# Patient Record
Sex: Female | Born: 1960 | Race: White | Hispanic: No | Marital: Married | Smoking: Never smoker
Health system: Southern US, Community
[De-identification: ages and names within clinical notes are randomized; demographics above are authoritative.]

## PROBLEM LIST (undated history)

## (undated) DIAGNOSIS — F419 Anxiety disorder, unspecified: Secondary | ICD-10-CM

## (undated) DIAGNOSIS — E039 Hypothyroidism, unspecified: Secondary | ICD-10-CM

## (undated) DIAGNOSIS — Z8249 Family history of ischemic heart disease and other diseases of the circulatory system: Secondary | ICD-10-CM

## (undated) DIAGNOSIS — B009 Herpesviral infection, unspecified: Secondary | ICD-10-CM

## (undated) DIAGNOSIS — G4700 Insomnia, unspecified: Secondary | ICD-10-CM

## (undated) DIAGNOSIS — J309 Allergic rhinitis, unspecified: Secondary | ICD-10-CM

## (undated) DIAGNOSIS — I341 Nonrheumatic mitral (valve) prolapse: Secondary | ICD-10-CM

## (undated) DIAGNOSIS — H409 Unspecified glaucoma: Secondary | ICD-10-CM

## (undated) DIAGNOSIS — M797 Fibromyalgia: Secondary | ICD-10-CM

## (undated) DIAGNOSIS — K760 Fatty (change of) liver, not elsewhere classified: Secondary | ICD-10-CM

## (undated) DIAGNOSIS — R079 Chest pain, unspecified: Secondary | ICD-10-CM

## (undated) DIAGNOSIS — K219 Gastro-esophageal reflux disease without esophagitis: Secondary | ICD-10-CM

## (undated) HISTORY — DX: Chest pain, unspecified: R07.9

## (undated) HISTORY — PX: WISDOM TOOTH EXTRACTION: SHX21

## (undated) HISTORY — PX: UTERINE FIBROID SURGERY: SHX826

## (undated) HISTORY — DX: Family history of ischemic heart disease and other diseases of the circulatory system: Z82.49

## (undated) HISTORY — PX: REPLACEMENT TOTAL KNEE: SUR1224

## (undated) HISTORY — DX: Allergic rhinitis, unspecified: J30.9

## (undated) HISTORY — DX: Fatty (change of) liver, not elsewhere classified: K76.0

## (undated) HISTORY — PX: CARDIAC CATHETERIZATION: SHX172

## (undated) HISTORY — DX: Hypothyroidism, unspecified: E03.9

## (undated) HISTORY — PX: BACK SURGERY: SHX140

## (undated) HISTORY — DX: Nonrheumatic mitral (valve) prolapse: I34.1

## (undated) HISTORY — DX: Herpesviral infection, unspecified: B00.9

## (undated) HISTORY — DX: Unspecified glaucoma: H40.9

## (undated) HISTORY — PX: TONSILLECTOMY: SUR1361

## (undated) HISTORY — PX: INTRAOCULAR LENS IMPLANT, SECONDARY: SHX1842

## (undated) HISTORY — DX: Fibromyalgia: M79.7

---

## 1999-01-11 ENCOUNTER — Ambulatory Visit (HOSPITAL_COMMUNITY): Admission: RE | Admit: 1999-01-11 | Discharge: 1999-01-11 | Payer: Self-pay | Admitting: Internal Medicine

## 1999-01-11 ENCOUNTER — Encounter: Payer: Self-pay | Admitting: Internal Medicine

## 1999-03-03 ENCOUNTER — Ambulatory Visit (HOSPITAL_COMMUNITY): Admission: RE | Admit: 1999-03-03 | Discharge: 1999-03-03 | Payer: Self-pay | Admitting: *Deleted

## 1999-11-26 ENCOUNTER — Other Ambulatory Visit: Admission: RE | Admit: 1999-11-26 | Discharge: 1999-11-26 | Payer: Self-pay | Admitting: *Deleted

## 2002-03-05 ENCOUNTER — Other Ambulatory Visit: Admission: RE | Admit: 2002-03-05 | Discharge: 2002-03-05 | Payer: Self-pay | Admitting: Obstetrics and Gynecology

## 2003-06-19 ENCOUNTER — Encounter: Admission: RE | Admit: 2003-06-19 | Discharge: 2003-06-19 | Payer: Self-pay | Admitting: Family Medicine

## 2003-06-19 ENCOUNTER — Encounter: Payer: Self-pay | Admitting: Family Medicine

## 2003-08-26 ENCOUNTER — Encounter: Admission: RE | Admit: 2003-08-26 | Discharge: 2003-08-26 | Payer: Self-pay | Admitting: Family Medicine

## 2004-12-08 ENCOUNTER — Other Ambulatory Visit: Admission: RE | Admit: 2004-12-08 | Discharge: 2004-12-08 | Payer: Self-pay | Admitting: Family Medicine

## 2005-01-04 ENCOUNTER — Emergency Department (HOSPITAL_COMMUNITY): Admission: EM | Admit: 2005-01-04 | Discharge: 2005-01-04 | Payer: Self-pay | Admitting: Emergency Medicine

## 2005-06-27 ENCOUNTER — Encounter: Admission: RE | Admit: 2005-06-27 | Discharge: 2005-06-27 | Payer: Self-pay | Admitting: Family Medicine

## 2005-08-17 ENCOUNTER — Ambulatory Visit (HOSPITAL_COMMUNITY): Admission: RE | Admit: 2005-08-17 | Discharge: 2005-08-17 | Payer: Self-pay | Admitting: Obstetrics and Gynecology

## 2005-08-17 ENCOUNTER — Encounter (INDEPENDENT_AMBULATORY_CARE_PROVIDER_SITE_OTHER): Payer: Self-pay | Admitting: Specialist

## 2005-08-17 ENCOUNTER — Ambulatory Visit (HOSPITAL_BASED_OUTPATIENT_CLINIC_OR_DEPARTMENT_OTHER): Admission: RE | Admit: 2005-08-17 | Discharge: 2005-08-17 | Payer: Self-pay | Admitting: Obstetrics and Gynecology

## 2006-05-22 ENCOUNTER — Encounter: Admission: RE | Admit: 2006-05-22 | Discharge: 2006-05-22 | Payer: Self-pay | Admitting: Emergency Medicine

## 2006-05-29 ENCOUNTER — Encounter: Admission: RE | Admit: 2006-05-29 | Discharge: 2006-05-29 | Payer: Self-pay | Admitting: Emergency Medicine

## 2006-06-28 ENCOUNTER — Encounter: Admission: RE | Admit: 2006-06-28 | Discharge: 2006-06-28 | Payer: Self-pay | Admitting: Emergency Medicine

## 2007-10-27 IMAGING — CR DG CHEST 2V
2 series · 2 of 2 positions shown · non-contrast
Comparison: 06/27/05.

CLINICAL DATA: Cough and fever.  Question pneumonia.  
 TWO VIEW CHEST:

[w chest pa]
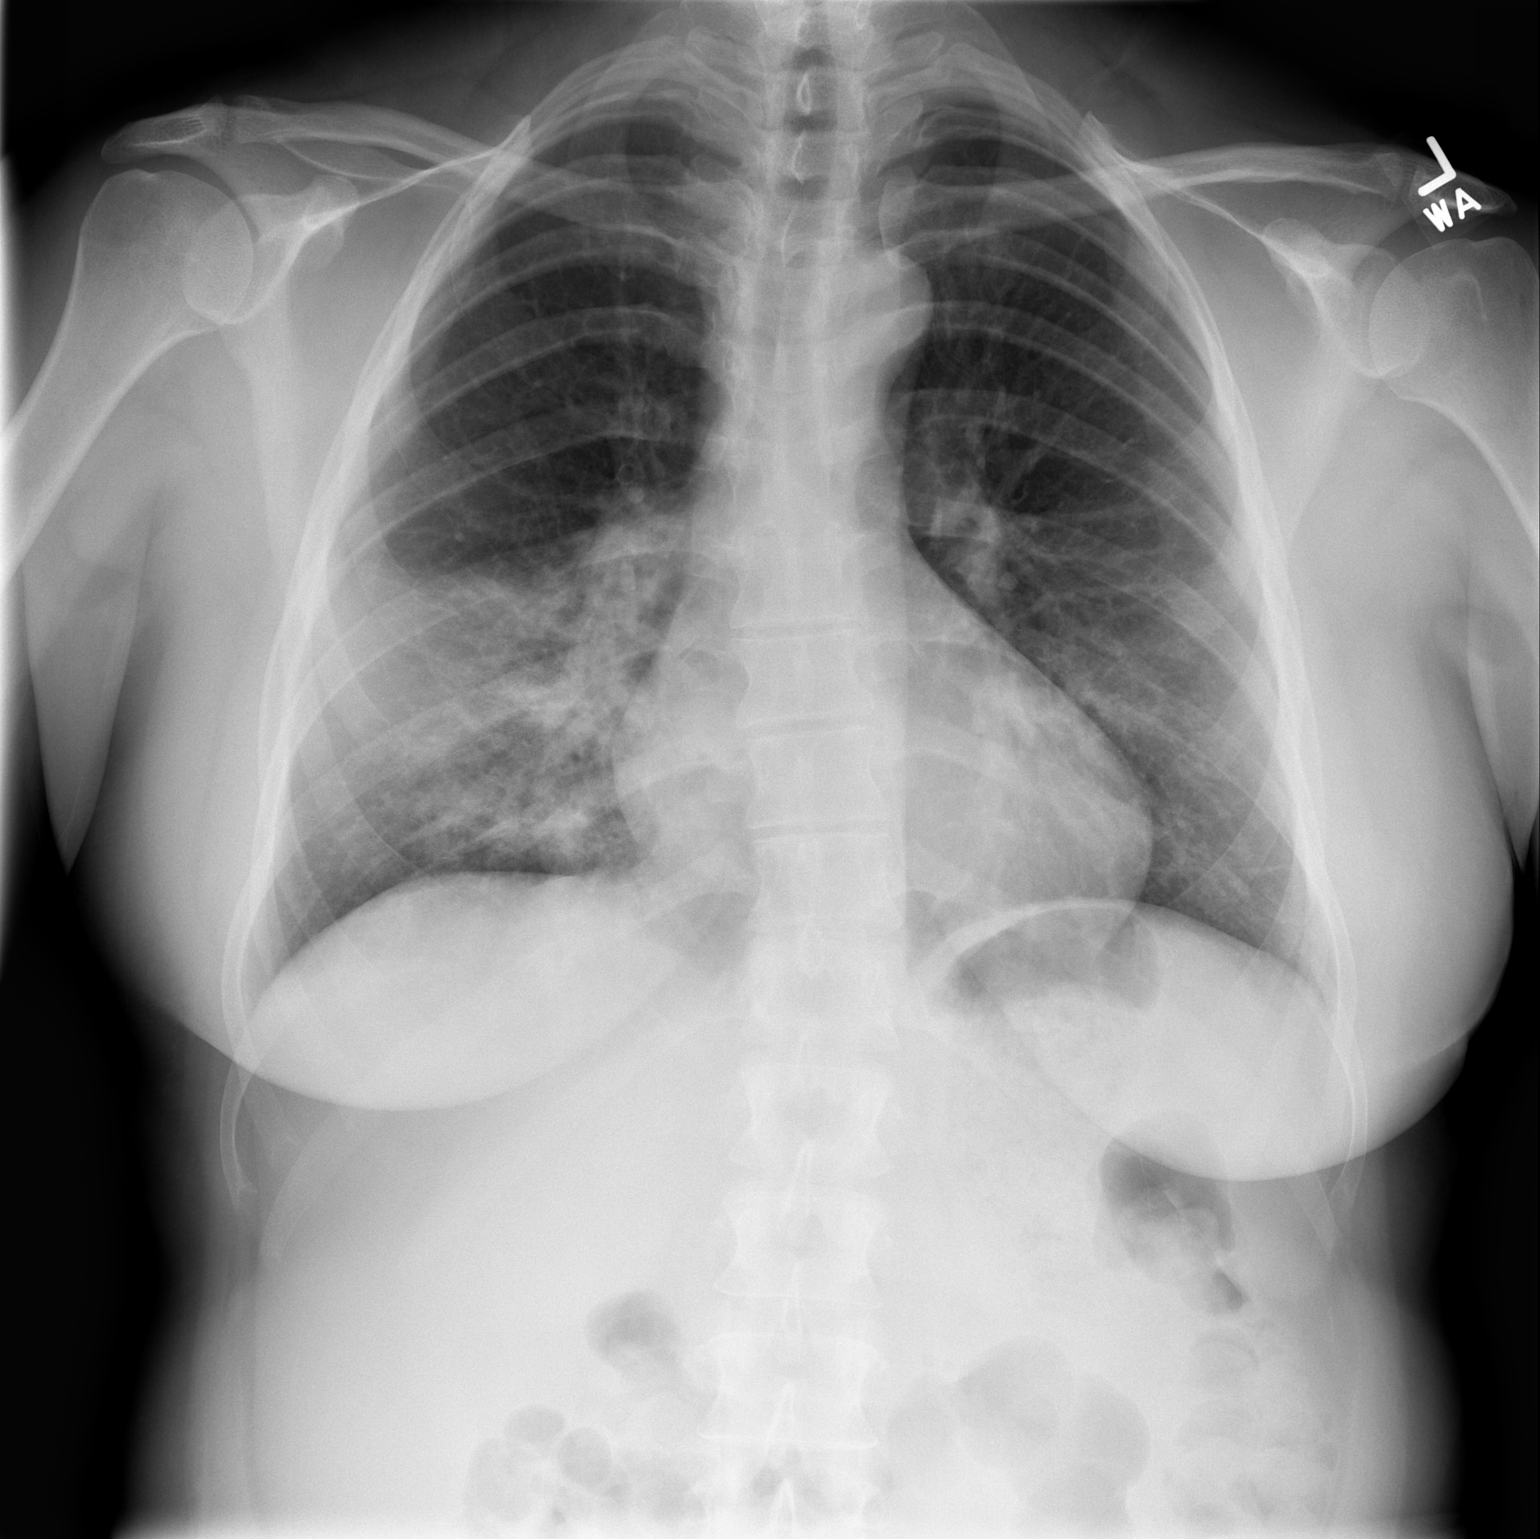

[w chest lat]
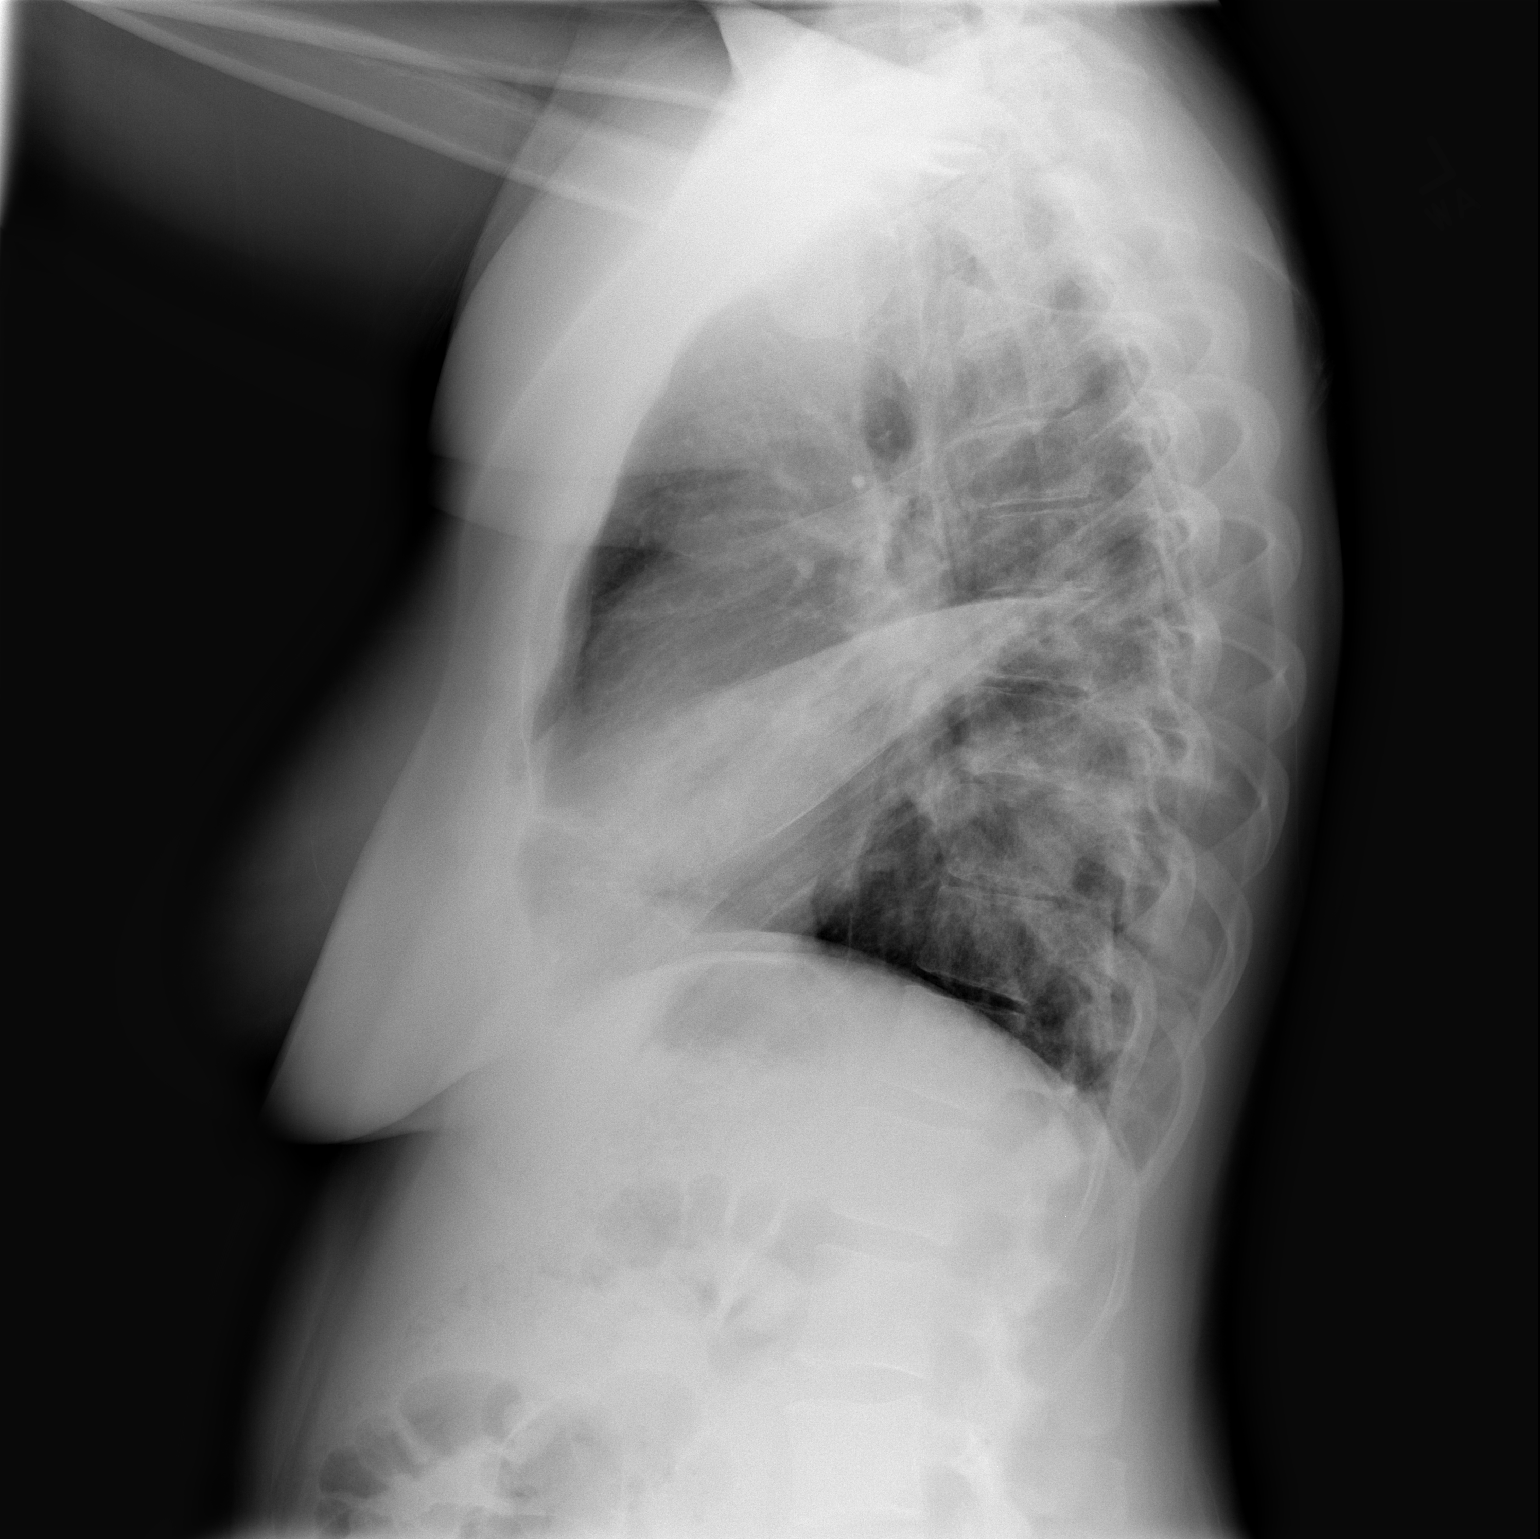

[2 of 2 positions shown; findings below may reference images not displayed]

FINDINGS: Midline trachea.  Heart size is at the upper limits of normal.  Mild prominence of the transverse aorta.  Costophrenic angles are sharp.  There is new dense air space disease in the right middle lobe.  There is more patchy air space disease in the superior segment right lower and likely superior segment left lower lobes.  No pneumothorax.  Mild S shaped spinal curvature.
IMPRESSION: Interval development of right greater than left patchy air space opacities most consistent with multifocal pneumonia.  Recommend radiographic follow up to confirm clearance.

## 2008-05-21 ENCOUNTER — Observation Stay (HOSPITAL_COMMUNITY): Admission: EM | Admit: 2008-05-21 | Discharge: 2008-05-21 | Payer: Self-pay | Admitting: Emergency Medicine

## 2008-06-04 ENCOUNTER — Inpatient Hospital Stay (HOSPITAL_BASED_OUTPATIENT_CLINIC_OR_DEPARTMENT_OTHER): Admission: RE | Admit: 2008-06-04 | Discharge: 2008-06-04 | Payer: Self-pay | Admitting: Interventional Cardiology

## 2008-06-13 ENCOUNTER — Encounter: Admission: RE | Admit: 2008-06-13 | Discharge: 2008-06-13 | Payer: Self-pay | Admitting: Family Medicine

## 2008-06-20 ENCOUNTER — Encounter: Admission: RE | Admit: 2008-06-20 | Discharge: 2008-06-20 | Payer: Self-pay | Admitting: Gastroenterology

## 2009-01-20 ENCOUNTER — Encounter: Admission: RE | Admit: 2009-01-20 | Discharge: 2009-01-20 | Payer: Self-pay | Admitting: Family Medicine

## 2009-08-14 ENCOUNTER — Encounter: Admission: RE | Admit: 2009-08-14 | Discharge: 2009-08-14 | Payer: Self-pay | Admitting: Family Medicine

## 2009-08-24 ENCOUNTER — Ambulatory Visit: Payer: Self-pay | Admitting: Thoracic Surgery

## 2009-09-07 ENCOUNTER — Ambulatory Visit (HOSPITAL_COMMUNITY): Admission: RE | Admit: 2009-09-07 | Discharge: 2009-09-07 | Payer: Self-pay | Admitting: Thoracic Surgery

## 2009-09-08 ENCOUNTER — Ambulatory Visit: Payer: Self-pay | Admitting: Thoracic Surgery

## 2010-02-11 ENCOUNTER — Encounter: Admission: RE | Admit: 2010-02-11 | Discharge: 2010-02-11 | Payer: Self-pay | Admitting: Family Medicine

## 2010-09-15 ENCOUNTER — Ambulatory Visit (HOSPITAL_COMMUNITY)
Admission: RE | Admit: 2010-09-15 | Discharge: 2010-09-15 | Payer: Self-pay | Source: Home / Self Care | Attending: Psychiatry | Admitting: Psychiatry

## 2010-10-18 LAB — COMPREHENSIVE METABOLIC PANEL
ALT: 23 U/L (ref 0–35)
AST: 27 U/L (ref 0–37)
Albumin: 4.6 g/dL (ref 3.5–5.2)
Alkaline Phosphatase: 62 U/L (ref 39–117)
BUN: 12 mg/dL (ref 6–23)
CO2: 28 mEq/L (ref 19–32)
Calcium: 9.9 mg/dL (ref 8.4–10.5)
Chloride: 101 mEq/L (ref 96–112)
Creatinine, Ser: 0.84 mg/dL (ref 0.4–1.2)
GFR calc Af Amer: >60 mL/min (ref >60)
GFR calc non Af Amer: >60 mL/min (ref >60)
Glucose, Bld: 90 mg/dL (ref 70–99)
Potassium: 4.4 mEq/L (ref 3.5–5.1)
Sodium: 138 mEq/L (ref 135–145)
Total Bilirubin: 0.5 mg/dL (ref 0.3–1.2)
Total Protein: 7 g/dL (ref 6.0–8.3)

## 2010-10-18 LAB — URINALYSIS, ROUTINE W REFLEX MICROSCOPIC
Bilirubin Urine: NEGATIVE
Hgb urine dipstick: NEGATIVE
Ketones, ur: NEGATIVE mg/dL
Nitrite: POSITIVE — AB
Protein, ur: NEGATIVE mg/dL
Specific Gravity, Urine: 1.017 (ref 1.005–1.030)
Urine Glucose, Fasting: NEGATIVE mg/dL
Urobilinogen, UA: 0.2 mg/dL (ref 0.0–1.0)
pH: 7.5 (ref 5.0–8.0)

## 2010-10-18 LAB — CBC
HCT: 42.8 % (ref 36.0–46.0)
Hemoglobin: 14.3 g/dL (ref 12.0–15.0)
MCH: 31.6 pg (ref 26.0–34.0)
MCHC: 33.4 g/dL (ref 30.0–36.0)
MCV: 94.7 fL (ref 78.0–100.0)
Platelets: 332 10*3/uL (ref 150–400)
RBC: 4.52 MIL/uL (ref 3.87–5.11)
RDW: 13.7 % (ref 11.5–15.5)
WBC: 7.9 10*3/uL (ref 4.0–10.5)

## 2010-10-18 LAB — URINE CULTURE
Colony Count: >=100000
Culture  Setup Time: 201201121533

## 2010-10-18 LAB — APTT: aPTT: 28 seconds (ref 24–37)

## 2010-10-18 LAB — URINE MICROSCOPIC-ADD ON

## 2010-10-18 LAB — PROTIME-INR
INR: 0.88 (ref 0.00–1.49)
Prothrombin Time: 12.1 seconds (ref 11.6–15.2)

## 2010-10-18 LAB — SURGICAL PCR SCREEN
MRSA, PCR: NEGATIVE
Staphylococcus aureus: NEGATIVE

## 2010-10-20 ENCOUNTER — Inpatient Hospital Stay (HOSPITAL_COMMUNITY)
Admission: RE | Admit: 2010-10-20 | Discharge: 2010-10-25 | Payer: Self-pay | Source: Home / Self Care | Attending: Orthopedic Surgery | Admitting: Orthopedic Surgery

## 2010-10-24 ENCOUNTER — Encounter: Payer: Self-pay | Admitting: Thoracic Surgery

## 2010-10-25 ENCOUNTER — Encounter: Payer: Self-pay | Admitting: Gastroenterology

## 2010-10-25 LAB — PROTIME-INR
INR: 1.25 (ref 0.00–1.49)
Prothrombin Time: 13.8 seconds (ref 11.6–15.2)
Prothrombin Time: 15.9 seconds — ABNORMAL HIGH (ref 11.6–15.2)

## 2010-10-25 LAB — TYPE AND SCREEN
ABO/RH(D): O POS
Antibody Screen: NEGATIVE
Unit division: 0
Unit division: 0

## 2010-10-25 LAB — PREPARE RBC (CROSSMATCH)

## 2010-10-25 LAB — BASIC METABOLIC PANEL
BUN: 6 mg/dL (ref 6–23)
CO2: 29 mEq/L (ref 19–32)
Calcium: 8 mg/dL — ABNORMAL LOW (ref 8.4–10.5)
Creatinine, Ser: 0.7 mg/dL (ref 0.4–1.2)
GFR calc non Af Amer: >60 mL/min (ref >60)
Glucose, Bld: 105 mg/dL — ABNORMAL HIGH (ref 70–99)
Glucose, Bld: 136 mg/dL — ABNORMAL HIGH (ref 70–99)
Potassium: 4.1 mEq/L (ref 3.5–5.1)
Sodium: 131 mEq/L — ABNORMAL LOW (ref 135–145)
Sodium: 139 mEq/L (ref 135–145)

## 2010-10-25 LAB — CBC
HCT: 28.3 % — ABNORMAL LOW (ref 36.0–46.0)
Hemoglobin: 9.7 g/dL — ABNORMAL LOW (ref 12.0–15.0)
MCH: 30.8 pg (ref 26.0–34.0)
MCH: 31.3 pg (ref 26.0–34.0)
MCHC: 32.8 g/dL (ref 30.0–36.0)
MCV: 93.8 fL (ref 78.0–100.0)
Platelets: 230 10*3/uL (ref 150–400)
RBC: 3.1 MIL/uL — ABNORMAL LOW (ref 3.87–5.11)
RDW: 13.2 % (ref 11.5–15.5)

## 2010-10-25 LAB — URINALYSIS, ROUTINE W REFLEX MICROSCOPIC
Bilirubin Urine: NEGATIVE
Nitrite: NEGATIVE
Specific Gravity, Urine: 1.003 — ABNORMAL LOW (ref 1.005–1.030)
Urine Glucose, Fasting: NEGATIVE mg/dL
pH: 6.5 (ref 5.0–8.0)

## 2010-10-26 LAB — CBC
HCT: 25.4 % — ABNORMAL LOW (ref 36.0–46.0)
HCT: 25.7 % — ABNORMAL LOW (ref 36.0–46.0)
Hemoglobin: 8.7 g/dL — ABNORMAL LOW (ref 12.0–15.0)
Hemoglobin: 8.7 g/dL — ABNORMAL LOW (ref 12.0–15.0)
MCH: 30.6 pg (ref 26.0–34.0)
MCV: 91.8 fL (ref 78.0–100.0)
Platelets: 213 10*3/uL (ref 150–400)
RBC: 2.8 MIL/uL — ABNORMAL LOW (ref 3.87–5.11)
RBC: 2.84 MIL/uL — ABNORMAL LOW (ref 3.87–5.11)
WBC: 6 10*3/uL (ref 4.0–10.5)
WBC: 5.4 10*3/uL (ref 4.0–10.5)

## 2010-10-26 LAB — BASIC METABOLIC PANEL
BUN: 7 mg/dL (ref 6–23)
CO2: 28 mEq/L (ref 19–32)
Chloride: 102 mEq/L (ref 96–112)
GFR calc Af Amer: >60 mL/min (ref >60)
GFR calc non Af Amer: >60 mL/min (ref >60)
Glucose, Bld: 106 mg/dL — ABNORMAL HIGH (ref 70–99)
Glucose, Bld: 104 mg/dL — ABNORMAL HIGH (ref 70–99)
Glucose, Bld: 103 mg/dL — ABNORMAL HIGH (ref 70–99)
Potassium: 3.6 mEq/L (ref 3.5–5.1)
Potassium: 4.3 mEq/L (ref 3.5–5.1)
Potassium: 4.5 mEq/L (ref 3.5–5.1)
Sodium: 137 mEq/L (ref 135–145)
Sodium: 137 mEq/L (ref 135–145)

## 2010-10-26 LAB — PROTIME-INR
Prothrombin Time: 18 seconds — ABNORMAL HIGH (ref 11.6–15.2)
Prothrombin Time: 17.1 seconds — ABNORMAL HIGH (ref 11.6–15.2)

## 2010-10-27 NOTE — Op Note (Addendum)
Allison Yang, Allison Yang                  ACCOUNT NO.:  [id number]  MEDICAL RECORD NO.:  [id number]          PATIENT TYPE:  INP  LOCATION:  5041                         FACILITY:  [facility]  PHYSICIAN:  Loreta Ave, M.D. DATE OF BIRTH:  05-08-1961  DATE OF PROCEDURE:  10/20/2010 DATE OF DISCHARGE:                              OPERATIVE REPORT   PREOPERATIVE DIAGNOSES: 1. Right knee end-stage degenerative arthritis, tricompartmental, most     marked patellofemoral joint and lateral compartment. 2. Left knee degenerative arthritis isolated to the patellofemoral     joint.  POSTOPERATIVE DIAGNOSES: 1. Right knee tricompartmental degenerative arthritis, most marked     patellofemoral joint and lateral compartment. 2. Left knee grade 4 degenerative joint disease patellofemoral joint     with radial tearing middle posterior aspect of lateral meniscus but     without significant degenerative in the medial and lateral     compartment.  PROCEDURE:  Right knee total knee replacement, modified minimally invasive approach, Stryker Triathlon prosthesis.  Soft tissue balancing. A cemented pegged posterior stabilized #4 femoral component.  Cemented #4 tibial component with a 9-mm insert.  Resurfacing 32-mm patellar component.  Left knee arthrotomy with unicompartmental patellofemoral replacement with a Stryker Avon prosthesis.  Cemented extra small femoral trochlear component.  Cemented small patellar component.  Debridement of lateral meniscus tear through arthrotomy.  SURGEON:  Loreta Ave, MD  ASSISTANT:  Genene Churn. Barry Dienes, [place], present throughout the entire case and necessary for timely completion of procedure.  ANESTHESIA:  General.  BLOOD LOSS:  Minimal.  SPECIMENS:  None.  CULTURES:  None.  COMPLICATIONS:  None.  DRESSING:  Soft compressive knee immobilizer on both sides and a Hemovac drain on the right.  TOURNIQUET TIME:  One hour on the left and one hour on the  right.  PROCEDURE IN DETAILS:  The patient was brought to the operating room, placed on the operating room table in supine position.  After adequate anesthesia has been obtained, both knees examined.  Both had marked patellofemoral crepitus, still very reasonable motion throughout.  A little hyperextension on both sides.  More gradient crepitus and valgus on the right.  Attention was turned to the left first.  Both had been prepped and draped with tourniquets applied in usual sterile fashion. On the left, exsanguinated with elevation of Esmarch.  Tourniquet was inflated to 350 mmHg.  Straight incision above the patella down to the tibial tubercle.  Skin and subcutaneous tissue were divided.  Medial arthrotomy extending up into the quad tendon.  Knee exposed.  Grade 4 change throughout the patellofemoral joint.  I then looked to the rest of knee.  I could visualize as well.  Cruciate ligament was intact. Medial meniscus and medial compartment looked great.  The lateral meniscus had a radial flap tear junction of the middle and posterior third and through the arthrotomy I had enough access to use arthroscopic baskets to saucerize this out and tapered it smoothly.  No significant degenerative changes seen.  Irrigated.  Attention turned back to the patellofemoral joint.  Utilizing the appropriate jigs and matching epicondylar axis,  anterior cut was made on the femur.  She was sized for an extra small component.  I used a combination of osteotome, rongeur and a power bur to then smooth out and deepen the portion of the trochlea for the undersurface of the extra small component.  When that was complete, it seated very well within the trochlea, about 4 mm above the top of the intercondylar notch after I removed the spurs.  The holes for the definitive component were drilled.  Trials put in place which had nice fitting, nice coverage and a nice interface into the condyle at the distal and both  medial and lateral.  Attention turned to the patella.  Posterior 10 mm removed.  Drilled, sized and fitted for a small component.  When that was complete, I brought her knee through motion with trials.  She tracked very well, was not overpowered medial or lateral side and lateral release not necessary.  Trials were removed. Copious irrigation with a pulse irrigating device.  Cement prepared and both components were firmly cemented in place.  Excessive cement removed.  Once the cement hardened, I again looked her knee and I was very pleased with alignment coverage and tracking it throughout.  The wound was closed with #1 Vicryl on the arthrotomy and then Vicryl and staples.  We then injected that knee intra-articularly with Marcaine. Temporary dressing applied.  Tourniquet was inflated and removed. Attention turned to the right knee.  Exsanguinated with elevation of Esmarch.  Tourniquet inflated to 350 mmHg.  Anterior incision above the patella down to the tibial tubercle.  Skin and subcutaneous tissue were divided.  Medial arthrotomy vastus splitting preserving the quad tendon. Knee was exposed.  Grade 4 change throughout the patellofemoral joint. Lateral compartment grade 4 throughout the tibial plateau.  Medial side did not look bad.  A lot of spurring along the lateral gutter. Sufficient changes throughout the knee to warrant total knee replacement.  Remnants of menisci, cruciate ligament, spurs, and loose bodies removed.  Intramedullary guide on the femur.  Distal cut 6 degrees of valgus resecting 10 mm.  Using the epicondylar axis, the femur was sized, cut and fitted for a posterior stabilized peg #4 component.  Proximal tibial resection, extramedullary guide, 3-degree posterior slope cut below the defect lateral.  Size #4 component.  All recess examined to be sure all loose body, spurs and meniscal fragments removed.  Patella exposed.  Posterior 10 mm removed.  Drill sized  and fitted for a 32-mm component.  Trials put in place; #4 on the femur, #4 on the tibia and a 32 on the patella.  With a 9-mm insert on the tibia, I was very pleased with flexion, extension, mechanical axis and patellofemoral tracking.  Tibia was marked for rotation and hand reamed. All trials have been removed.  Copious irrigation with a pulse irrigating device.  Cement prepared, placed on all components, firmly seated.  Polyethylene attached to tibia.  Patella clamp.  Once the cement hardened, I again examined the knee, was pleased with alignment, stability, motion and tracking.  Wound irrigated again.  Hemovac placed through a separate stab wound.  Arthrotomy closed with #1 Vicryl.  Skin and subcutaneous tissue with Vicryl and staples.  Sterile compressive dressing applied. Hemovac clamped.  Knee immobilizer applied.  A definitive dressing was placed on the opposite knee and knee immobilizer applied there. Anesthesia was reversed.  Brought to recovery room.  Tolerated the surgery well.  No complications.     Margarette Asal.  Eulah Pont, M.D.     DFM/MEDQ  D:  10/21/2010  T:  10/22/2010  Job:  [id number]  Electronically Signed by Mckinley Jewel M.D. on 10/27/2010 04:22:04 PM

## 2010-11-03 ENCOUNTER — Encounter: Payer: Self-pay | Admitting: Cardiovascular Disease

## 2010-11-03 ENCOUNTER — Encounter (INDEPENDENT_AMBULATORY_CARE_PROVIDER_SITE_OTHER): Payer: 59

## 2010-11-03 DIAGNOSIS — R609 Edema, unspecified: Secondary | ICD-10-CM | POA: Insufficient documentation

## 2010-11-10 NOTE — Miscellaneous (Signed)
Summary: Orders Update  Clinical Lists Changes  Problems: Added new problem of EDEMA (ICD-782.3) Orders: Added new Test order of Venous Duplex Lower Extremity (Venous Duplex Lower) - Signed 

## 2010-11-22 NOTE — Discharge Summary (Signed)
NAMESKYLEY, Allison Yang                  ACCOUNT NO.:  [id number]  MEDICAL RECORD NO.:  [id number]          PATIENT TYPE:  INP  LOCATION:  5041                         FACILITY:  [facility]  PHYSICIAN:  Loreta Ave, M.D. DATE OF BIRTH:  Sep 01, 1961  DATE OF ADMISSION:  10/20/2010 DATE OF DISCHARGE:  10/25/2010                              DISCHARGE SUMMARY   FINAL DIAGNOSES: 1. Status post right total knee replacement for end-stage degenerative     joint disease and left knee patellofemoral unicompartmental     replacement for end-stage degenerative joint disease. 2. Anxiety/depression. 3. Asthma. 4. Peptic ulcer disease. 5. Fibromyalgia. 6. Mitral valve prolapse.  HISTORY OF PRESENT ILLNESS:  A 50 year old white female with history of end-stage DJD, bilateral knees and chronic pain presented to our office for preop evaluation for right total knee replacement and left knee patellofemoral unicompartmental replacement.  She had progressive worsening pain with failed response with conservative treatment. Significant decrease in her daily activities due to the ongoing complaint.  HOSPITAL COURSE:  On October 20, 2010, the patient was taken to the [facility] OR and a right total knee replacement and left knee patellofemoral unicompartmental replacement procedures were performed. Surgeon Mckinley Jewel, MD and assistant Zonia Kief PA-C.  Anesthesia general with femoral nerve block on the right.  Tourniquet time about 60 minutes on both legs.  One Hemovac drain placed in the right knee.  No specimens or cultures.  There are no surgical or anesthesia complications and the patient was transferred to recovery in stable condition.  On October 21, 2010, the patient was doing well with good pain control.  Temperature 98.3, pulse 73, respirations 16, blood pressure 149/73.  WBC 8.1, hemoglobin 8.5, hematocrit 25.9, platelets 230.  Sodium 131, potassium 4.6, chloride at 97, CO2 29, BUN  6, creatinine 0.70, glucose 105, INR 1.04.  Preop hemoglobin October 14, 2010 was 14.3.  I did order a repeat H and H later in the afternoon January 19 and hemoglobin 8.2.  I ordered transfusion 2 units packed red blood cells.  UA was negative.  PT, OT consults.  Pharmacy protocol Coumadin and Lovenox started for DVT prophylaxis.  Awaiting placement at [facility] for rehab.  Bilateral knee dressings clean, dry and intact. Calf nontender and neurovascularly intact.  Skin is warm and dry.  On October 22, 2010, the patient again doing very well.  Vital signs stable, afebrile.  WBC 7.3, hemoglobin 9.7, hematocrit 28.3, platelets 185.  Sodium 139, potassium 4.1, chloride 102, CO2 31, glucose 136, BUN 4, creatinine 0.73, INR 1.25.  Bilateral knee incisions looked good and staples intact.  No sign of infection.  Hemovac drain, right knee removed.  Bilateral calves nontender and neurovascular intact.  Skin is warm and dry.  Awaiting placement at [facility].  Saline locked IV. Discontinue the Foley.  MEDICATIONS: 1. Percocet 10/325, 1-2 tabs p.o. q. 4-6 hours p.r.n. for pain. 2. Robaxin 500 mg 1 tablet p.o. q.6 hours for spasms. 3. Lovenox 30 mg one subcu injection q.12 hours and discontinue and     Coumadin is therapeutic with INR of 2-3.  4. Coumadin pharmacy protocol.  Pharmacist to dose and maintain INR 2-     3 x4 weeks postop for DVT prophylaxis. 5. Fluoxetine 20 mg p.o. daily. 6. Buspirone 10 mg 2 tablets p.o. daily. 7. Alprazolam 0.5 mg 1 tablet p.o. at bedtime. 8. Estazolam 1 mg p.o. at bedtime. 9. Prometrium oral capsule 100 mg p.o. daily. 10.Vivelle-Dot 0.05 mg transdermal one patch twice weekly.  CONDITION:  Good and stable.  DISPOSITION:  Transfer to [facility] for rehab.  INSTRUCTIONS:  While at the facility, the patient will work with PT and OT to improve ambulation and knee range of motion and strengthening. Weightbear as tolerated.  Daily dressing changes with 4x4  gauze and TED hose can be applied over this.  Coumadin x4 weeks postop for DVT prophylaxis.  Discontinue Lovenox when Coumadin is therapeutic with INR 2-3.  She is okay to shower, but no tub soaking.  Do not apply any creams or ointments to her incisions.  Needs return office visit with Dr. Eulah Pont once she is 2 weeks postop.  Call the office immediately if there are any questions or concerns.     Genene Churn. Denton Meek.   ______________________________ Loreta Ave, M.D.    JMO/MEDQ  D:  10/22/2010  T:  10/22/2010  Job:  [id number]  Electronically Signed by Zonia Kief P.A. on 11/03/2010 12:15:42 PM Electronically Signed by Mckinley Jewel M.D. on 11/22/2010 02:57:51 PM

## 2011-01-04 LAB — GLUCOSE, CAPILLARY: Glucose-Capillary: 91 mg/dL (ref 70–99)

## 2011-02-15 NOTE — H&P (Signed)
Allison Yang, Allison Yang                  ACCOUNT NO.:  [id number]   MEDICAL RECORD NO.:  [id number]          PATIENT TYPE:  INP   LOCATION:  4707                         FACILITY:  [facility]   PHYSICIAN:  Hollice Espy, M.D.DATE OF BIRTH:  Feb 01, 1961   DATE OF ADMISSION:  05/20/2008  DATE OF DISCHARGE:                              HISTORY & PHYSICAL   PRIMARY CARE PHYSICIAN:  The patient's PCP is Dr. Otilio Connors. Gerri Spore,  M.D.   CHIEF COMPLAINT:  Chest pain, nausea and vomiting.   HISTORY OF THE PRESENT ILLNESS:  The patient is a 50 year old white  female with a past medical history of fibromyalgia and a recent UTI who  today was at the movie theater when all of a sudden she started feeling  some vague abdominal pain, and all of a sudden this pain that started in  the midepigastrium radiating up into her chest.  She had some associated  nausea with this and then ended up having some vomiting episodes.  She  felt lightheaded and then she became concerned, so she came into the  emergency room.  In the emergency room she had an EKG that showed normal  sinus rhythm, a chest x-ray, which was unremarkable, and lab work  including normal cardiac markers.  She states, however, her symptoms did  not fully resolve until after she got her nitroglycerin and aspirin.  Currently, she is feeling okay.  She complains of some mild nausea and  feels very full wired.   REVIEW OF SYSTEMS:  The patient denies any headaches, vision changes or  dysphagia.  She has no chest pain or palpitations.  Currently no  wheezing or  coughing.  No current abdominal pain.  No hematuria,  dysuria, constipation, diarrhea, or focal extremity numbness, weakness  or pain.  The review of systems is otherwise negative.   PAST MEDICAL HISTORY:  The patient past medical history includes:  1. Fibromyalgia.  2. Recent UTI.   MEDICATIONS:  The medications she is on include Ultram, BuSpar Levaquin  and Xanax.   ALLERGIES:  The  patient has no known drug allergies.   SOCIAL HISTORY:  The patient denies any tobacco, heavy alcohol or drug  use.   FAMILY HISTORY:  The family history is noncontributory.   PHYSICAL EXAMINATION:  VITAL SIGNS:  The patient's vital signs on  admission are temperature 98.1, heart rate 80, blood pressure was 177/72  and is now down to 150/77, respirations 20% , and O2 sat 100% on room  air.  GENERAL APPEARANCE:  In general she is alert and oriented x3, and in no  apparent distress.  HEENT:  Normocephalic and atraumatic.  Her mucous membranes are moist.  NECK:  The neck reveals she has no carotid bruits.  HEART:  The heart has a regular rate and rhythm, S1 and S2.  LUNGS:  The lungs are clear to auscultation bilaterally.  ABDOMEN:  The abdomen is soft, obese and nontender.  Positive bowel  sounds.  EXTREMITIES:  The extremities show no clubbing, cyanosis or edema.   LABORATORY WORK:  The chest  x-rays is as per the HPI.  EKG shows normal  sinus rhythm.  Sodium 139, potassium 3.6, chloride 103, bicarb 26, BUN  9, creatinine 0.8, and glucose 102.  White count 8.4, H&H are 15 and 43,  MCV 90, and platelet count of 301,000 with 75% neutrophils.  CPK 50.6,  MB less than 1 and troponin I less than 0.05.   ASSESSMENT AND PLAN:  1. Chest pain, atypical.  Suspect that this may be more      gastrointestinal-related.  We will check a lipase level and also      check two more sets of enzymes with the next set being at 6:00 A.M.      Keep her on telemetry.  If enzymes are negative we will discharge      her home with a possible outpatient stress test.  2. Urinary tract infection.  Continue Levaquin.  3. Sleep disorder.  Continue Xanax.      Hollice Espy, M.D.  Electronically Signed     SKK/MEDQ  D:  05/21/2008  T:  05/21/2008  Job:  [id number]

## 2011-02-15 NOTE — Consult Note (Signed)
NAMENEISHA, HINGER                  ACCOUNT NO.:  [id number]   MEDICAL RECORD NO.:  [id number]          PATIENT TYPE:  INP   LOCATION:  4707                         FACILITY:  [facility]   PHYSICIAN:  Corky Crafts, MDDATE OF BIRTH:  1960-10-11   DATE OF CONSULTATION:  05/21/2008  DATE OF DISCHARGE:  05/21/2008                                 CONSULTATION   CHIEF COMPLAINT:  Chest pain.   HISTORY:  Ms. Loreta Ave is a 50 year old female with no known history of  coronary artery disease, who developed an acute epigastric/lower sternal  chest pain yesterday while at the movies.  This was accompanied by  nausea, vomiting, and diaphoresis.  She came home and then decided to  come to the emergency room.  She has had no previous episodes.  She  states she received a GI cocktail in the ER and states she could not  tell if it helped.  She was given aspirin in the ER, but denies any  nitroglycerin.   PAST MEDICAL HISTORY:  1. Fibromyalgia.  2. Recent UTI.   SOCIAL HISTORY:  She lives in [place] with her husband.  She takes  care of her 2 kids and 2 handicap sisters.  She denies any tobacco,  excessive alcohol use, and no drug use.   FAMILY HISTORY:  Mom had an MI in her 63s.   REVIEW OF SYSTEMS:  As above.  Otherwise, negative.   ALLERGIES:  No known drug allergies.   MEDICATIONS:  1. Xanax p.r.n.  2. Cipro 500 mg p.o. q.8 h.  3. Maalox.   PHYSICAL EXAMINATION:  VITAL SIGNS:  Temperature 97.1, pulse 61,  respirations 20, blood pressure 108/49, and O2 saturations 93% on room  air.  GENERAL:  She is in no acute distress.  HEENT:  Grossly normal.  No carotid or subclavian bruits.  No JVD or  thyromegaly.  Sclerae clear.  Conjunctivae normal.  Nares without  drainage.  CHEST:  Clear to auscultation bilaterally.  No wheezing or  rhonchi.  HEART:  Regular rate and rhythm.  No evidence of murmur.  ABDOMEN:  Good bowel sounds, nontender, and nondistended.  No masses or  bruits.  LOWER  EXTREMITIES:  No peripheral edema.  SKIN:  Warm and dry.  NEUROLOGIC:  Cranial nerves II through XII grossly intact.  Normal mood  and affect.   Chest x-ray, no active disease.   LABORATORY DATA:  Sodium 139, potassium 3.6, BUN 9, and creatinine 0.8.  Hemoglobin 14.9, hematocrit 43, platelets 301, and white count 8.4.  Point-of-care markers negative x1.  Lipase 18, CK-MB 50/2.2 with  troponin of 0.41.  EKG, normal sinus rhythm, right 64 with no acute ST-T  wave changes.   ASSESSMENT AND PLAN:  1. Apical chest pain.  2. Fibromyalgia.  3. Recent urinary tract infection, undergoing current treatment.  4. Family history of coronary artery disease.   The patient has normal CK-MBs with a positive troponin.  We will check  in addition serum enzymes.  If her troponin is trending up, we will need  to further workup as an  inpatient.  Otherwise, if troponin is normal or  trending downward, we can perform a Cardiolite as an outpatient.  The  patient wants to go home.  I have discussed cath versus Cardiolite with  her.  We will see what the next set of enzymes shows.   I have personally seen, interviewed and examined this patient.  Assessment and plan as outlined above.   -Jay Varanasi,MD      Guy Franco, P.A.      Corky Crafts, MD  Electronically Signed    LB/MEDQ  D:  05/21/2008  T:  05/21/2008  Job:  [id number]

## 2011-02-15 NOTE — Cardiovascular Report (Signed)
NAMEFLORENDA, Allison Yang                  ACCOUNT NO.:  [id number]   MEDICAL RECORD NO.:  [id number]          PATIENT TYPE:  OIB   LOCATION:  1961                         FACILITY:  [facility]   PHYSICIAN:  Corky Crafts, MDDATE OF BIRTH:  [birth year]   DATE OF PROCEDURE:  DATE OF DISCHARGE:  06/04/2008                            CARDIAC CATHETERIZATION   REFERRING PHYSICIAN:  Carola J. Gerri Spore, MD   PROCEDURES PERFORMED:  1. Left heart catheterization.  2. Left ventriculogram.  3. Coronary angiogram.  4. Abdominal aortogram.   OPERATOR:  Corky Crafts, MD   INDICATIONS:  Persistent chest pain, shortness of breath, and  palpitation   PROCEDURE NOTE:  The risks and benefits of cardiac catheterization were  explained to the patient and informed consent was obtained.  She was  brought to the Cath Lab and prepped and draped in the usual sterile  fashion.  The right groin infiltrated with 1% lidocaine.  A 4-French  sheath was placed into the right femoral artery using modified Seldinger  technique.  Left coronary artery angiography was performed using a JL4.5  catheter.  Right coronary artery angiography was performed using a JR4.0  catheter.  Both arteries were visualized using hand injection contrast  in multiple projections.  A pigtail catheter was advanced to the  ascending aorta and across the aortic valve.  The left ventricle was  imaged in the RAO projection with a power injection of contrast.  The  catheter was pulled back under continuous hemodynamic pressure  monitoring.  The catheter was then pulled back to the abdominal aorta.  A power injection of contrast was performed to image the abdominal  aorta.   FINDINGS:  The left main is angiographically normal and widely patent.  The left circumflex is a large vessel proximally.  There is a large OM1,  which is angiographically normal.  The left anterior descending is a  medium-sized vessel with a medium-sized first  diagonal, both of which  are widely patent.  The mid to distal LAD after the diagonal is small  and does not reach the apex.  It is patent.  The right coronary artery  is a large dominant vessel.  There is a large PDA, which supplies the  distal LAD territory.  There is a medium-sized posterolateral artery,  which is also widely patent.  The left ventriculogram shows normal  ventricular function with an estimated ejection fraction of 55%.  There  is no abdominal aortic aneurysm.  The abdominal aortogram shows no  abdominal aortic aneurysm.  There are bilateral single renal arteries,  which are widely patent.  The left ventricular pressure 146/10 with an  LVEDP of 18.  Aortic pressure 142/74 with a mean aortic pressure of 104.   IMPRESSION:  1. No significant coronary artery disease.  2. Normal left ventricular function.  3. No renal artery stenosis.  4. Abdominal aortic aneurysm.  5. Noncardiac chest pain.   RECOMMENDATIONS:  The patient should continue with aggressive risk  factor modification.  We will consider putting an event monitor on her  if her palpitations continue.  Corky Crafts, MD  Electronically Signed     JSV/MEDQ  D:  06/04/2008  T:  06/04/2008  Job:  [id number]

## 2011-02-15 NOTE — Letter (Signed)
August 24, 2009   Carola J. Gerri Spore, MD  [address]  [address]  [place], [place] [address]   Re:  Allison Yang, Allison D                  DOB:  08/21/1961   Dear Dr. Gerri Spore:   I saw the patient back today.  This 50 year old nonsmoker has been  followed for a few lesions in the right lobe, a 9-mm lesion in the right  upper lobe, and a 9- to 10-mm lesion in the right lower lobe.  She had 3  successive CT scans and essentially showed that the lesions are stable,  although it is thought they may hit the right lower lobe, one may be  increased slightly over the last 12 months.  She is a nonsmoker, has had  no fever or chills, does have a history of having breast cancer in her  family.  She has no fever, chills, or excessive sputum.   MEDICATIONS:  Zantac half a tab at night, ProSom, BuSpar every other  day, Valtrex daily.   ALLERGIES:  She is allergic to erythromycin, causes GI upset.   PAST MEDICAL HISTORY:  Herpes simplex.   FAMILY HISTORY:  Noncontributory.   SOCIAL HISTORY:  She is married.  She works as a Emergency planning/management officer.  Drinks 4-6 glasses of wine per week.  Does not smoke.   REVIEW OF SYSTEMS:  GENERAL:  She is 5 feet 7 inches, 180 pounds.  In  general, her weight has been stable.  CARDIAC:  She has mitral valve prolapse.  She was having chest pain and  that is what started her initial workup for a CT scan.  She also has  palpitations.  No angina.  PULMONARY:  No hemoptysis.  GI:  No nausea, vomiting, constipation, or diarrhea.  GU:  No kidney disease, dysuria, or frequent urination.  VASCULAR:  No claudication, DVT, TIAs.  NEUROLOGIC:  No dizziness, headaches, blackouts, or seizure.  MUSCULOSKELETAL:  No arthritis.  PSYCHIATRIC:  No depression or nervousness.  ENT:  No change in eyesight or hearing.  HEMATOLOGIC:  No problems with bleeding, clotting disorders, or anemia.   PHYSICAL EXAMINATION:  General:  She is a well-developed Caucasian  female in no acute  distress.  Vital Signs:  Her blood pressure is  112/74, pulse 72, respirations 18, sats were 96%.  Head, Eyes, Ears,  Nose, and Throat:  Unremarkable.  Neck:  Supple without thyromegaly.  There is no supraclavicular or axillary adenopathy.  Chest:  Clear to  auscultation and percussion.  Heart:  Regular sinus rhythm.  No murmur.  There is a 1/6 systolic ejection murmur.  Abdomen:  Soft.  There is no  hepatosplenomegaly.  Extremities:  Pulses are 2+.  There is no clubbing  or edema.  Neurologic:  He is oriented x3.  Sensory and motor intact.  Cranial nerves intact.   ASSESSMENT AND PLAN:  I think these are probably benign nodules that  could be either probably granulomas, but given the size and everything  and questionably that one is larger, I think we need to get a PET scan  on her.  I explained that if the PET scan is positive, I would probably  recommend resection, and if the PET scan is negative, I would continue  to follow her.  I will see her back again after a PET scan.  I  appreciate the opportunity of seeing the patient.   Sincerely,  Ines Bloomer, M.Yang.  Electronically Signed   DPB/MEDQ  Yang:  08/24/2009  T:  08/25/2009  Job:  [id number]

## 2011-02-15 NOTE — Letter (Signed)
September 08, 2009   Carola J. Gerri Spore, MD  [address]  [address]  [place], [place] [address]   Re:  Allison Yang, IYER                  DOB:  1960-12-08   Dear Dr. Gerri Spore:   I saw the patient back today, and I reviewed her PET scan.  As you know,  she has 2 approximately 9-mm lesions, one in the right upper lobe and  one in the right lower lobe, both of which are noncalcified.  There is a  question if the right lower lobe was increased in size.  The PET scan  today was completely negative with no evidence of any uptake.  Because  of this and high probability of this just a benign lesion such as a  granuloma , we will plan to repeat another CT scan in about 9 months.  I  have explained this to her and her husband and they agree with this  plan.  Her blood pressure is 112/80, pulse 79, respirations 18, sats  were 98%.  Lungs clear to auscultation and percussion.   Ines Bloomer, M.D.  Electronically Signed   DPB/MEDQ  D:  09/08/2009  T:  09/09/2009  Job:  [id number]

## 2011-02-15 NOTE — Discharge Summary (Signed)
Allison Yang, Allison Yang                  ACCOUNT NO.:  [id number]   MEDICAL RECORD NO.:  [id number]          PATIENT TYPE:  INP   LOCATION:  4707                         FACILITY:  [facility]   PHYSICIAN:  Michiel Cowboy, MDDATE OF BIRTH:  02-Jul-1961   DATE OF ADMISSION:  05/20/2008  DATE OF DISCHARGE:  05/21/2008                               DISCHARGE SUMMARY   CONSULTANT:  Corky Crafts, M.D.   CHIEF COMPLAINT:  Chest pain.   DISCHARGE DIAGNOSES:  1. Atypical chest pain with isolated elevated troponin x1.  2. Urinary tract infection.   The patient is a 50 year old female who was sitting down in a movie  theater when she started to have some nausea, vomiting and developed  chest pain thereafter.  She presented to the emergency department, where  she got some nitroglycerin with some relief.  She was admitted for  observation.  Cardiac markers first set was negative.  Second set did  show a troponin elevation to 0.41.  On repeat cardiac markers, this had  resolved.  Her EKG did not show any evidence of ischemia or infarction.  A chest x-ray on August 18 showed no acute cardiopulmonary findings.  Her symptoms completely resolved, and currently she is feeling back to  normal with the exception of a small headache.  The patient is to be  discharged to home with outpatient stress test to be performed on  Friday, 2 days from now.   For her urinary tract infection, we will continue her Levaquin, and Dr.  Gerri Spore will follow up as an outpatient.  While here, the patient was  further risk-stratified, and her LDL was 99.   For her fibromyalgia, continue home medications.   DISCHARGE MEDICATIONS:  1. Xanax, resume home dose.  2. BuSpar, resume home dose.  3. Levaquin 250 mg daily.  4. Ultram as needed for pain.   The patient to resume her home prescription.   FOLLOWUP:  The patient is to follow up with Dr. Gerri Spore and with Dr.  Eldridge Dace on Friday for a stress  test.      Michiel Cowboy, MD  Electronically Signed     AVD/MEDQ  D:  05/21/2008  T:  05/21/2008  Job:  [id number]   cc:   Otilio Connors. Gerri Spore, M.D.  Corky Crafts, MD

## 2011-02-18 NOTE — Op Note (Signed)
NAMEPARASKEVI, Allison Yang                  ACCOUNT NO.:  [id number]   MEDICAL RECORD NO.:  [id number]          PATIENT TYPE:  AMB   LOCATION:  [facility]                         FACILITY:  [facility]   PHYSICIAN:  Sherry A. Dickstein, M.D.DATE OF BIRTH:  03-31-1961   DATE OF PROCEDURE:  08/17/2005  DATE OF DISCHARGE:                                 OPERATIVE REPORT   PREOPERATIVE DIAGNOSES:  1.  Menorrhagia.  2.  Submucosal fibroid.   POSTOPERATIVE DIAGNOSES:  1.  Menorrhagia.  2.  Endometrial polyps.   PROCEDURE:  D&C hysteroscopy with resectoscope.   SURGEON:  Dr. Rosalio Macadamia   ANESTHESIA:  MAC.   INDICATIONS:  This is a 50 year old, G2, P2-0-0-2 woman, who has been having  excessively heavy bleeding with her menstrual periods.  The patient was  evaluated with an ultrasound which showed some thickening in the endometrial  cavity.  The patient returned for a sonohysterogram.  This procedure  revealed a mass in the endometrial cavity which was felt to be consistent  with a submucosal fibroid.  Therefore, the patient is brought to the  operating room for a [facility] hysteroscopy with resectoscope.   FINDINGS:  Normal sized, anteflexed uterus.  No adnexal mass.  Endometrial  polyps present.   PROCEDURE:  The patient was brought into the operating room and given  adequate IV sedation.  She was placed in a dorsolithotomy position.  Her  perineum was washed with Betadine.  Pelvic examination was performed.  Bladder was in-and-out catheterized.  The patient was draped in a sterile  fashion.  Speculum was placed within the vagina.  Vagina was washed with  Betadine.  Paracervical block was administered with 1% Nesacaine.  Anterior  lip of the cervix was grasped with a single-tooth tenaculum.  Cervix was  sounded.  Cervix was dilated with Pratt dilators to a #31.  The hysteroscope  was then easily introduced into the endometrial cavity.  Pictures were  obtained.  Polypoid tissue was present.  This was  removed initially and then  using a double loop resector, endometrial polyps were removed separately.  The endometrial resections were then taken separately, taking superficial  sheets of endometrial tissue circumferentially.  Adequate hemostasis was  obtained.  Pictures were obtained.  Small portion of the plastic from the  hysteroscope was seen in the endometrial cavity.  This was removed with the  resectoscope.  Picture was obtained showing that all tissue was removed  including all plastic from the hysteroscope had been removed.  A small  cervical polyp had been present at the cervical os.  This was removed with  polyp forceps.  Once adequate hemostasis was present throughout, all  instruments were removed from the vagina.  The patient was taken out of the  dorsolithotomy position.  She was awakened.  She was moved from the  operating table to a stretcher in stable condition.  Complications were  none.  Estimated blood loss less than 5 mL.   SPECIMENS:  1.  Endometrial polyp.  2.  Endometrial resections.  3.  Cervical polyp.   SORBITOL DIFFERENTIAL:  -  25 mL.      Sherry A. Rosalio Macadamia, M.D.  Electronically Signed     SAD/MEDQ  D:  08/17/2005  T:  08/17/2005  Job:  [id number]

## 2011-09-09 ENCOUNTER — Other Ambulatory Visit: Payer: Self-pay | Admitting: Gastroenterology

## 2012-03-26 IMAGING — CR DG KNEE 1-2V*L*
1 series · 1 of 1 positions shown · non-contrast
Comparison: None available.

CLINICAL DATA: DJD of the left knee.  Status post surgery.

LEFT KNEE - 1-2 VIEW

[knee lat]
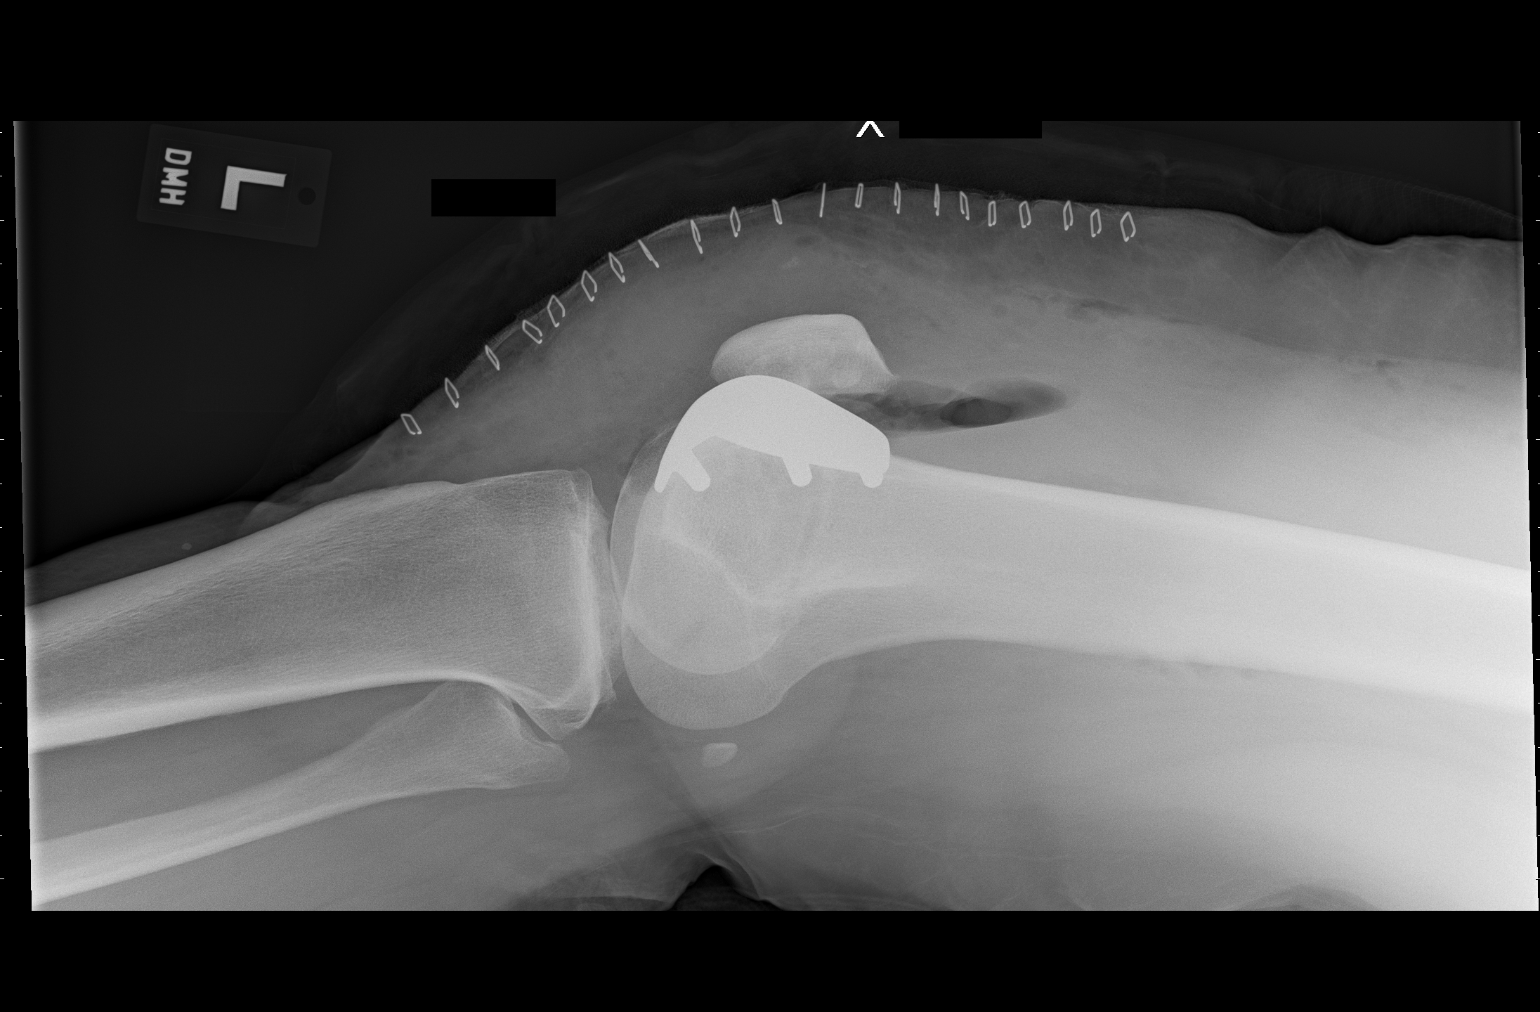

[1 of 1 positions shown; findings below may reference images not displayed]

FINDINGS: The patient is status post placement of a patellar
prosthesis.  There is a radiolucent component along posterior
surface of patella.  A metallic prosthesis is seen in the anterior
aspect of distal femur.  An air-fluid level is compatible with a
joint effusion.  The knee is located.  No fracture is evident.
Extensive prepatellar soft tissue swelling is noted.
IMPRESSION: 1.  Status post partial prosthesis of the right knee.
2.  Large joint effusion and extensive prepatellar soft tissue
swelling.

## 2013-11-07 ENCOUNTER — Other Ambulatory Visit: Payer: Self-pay | Admitting: Family Medicine

## 2013-11-07 ENCOUNTER — Ambulatory Visit
Admission: RE | Admit: 2013-11-07 | Discharge: 2013-11-07 | Disposition: A | Discharge status: Home / Self Care | Payer: 59 | Source: Ambulatory Visit | Attending: Family Medicine | Admitting: Family Medicine

## 2013-11-07 DIAGNOSIS — S6980XA Other specified injuries of unspecified wrist, hand and finger(s), initial encounter: Secondary | ICD-10-CM

## 2013-11-07 DIAGNOSIS — S6990XA Unspecified injury of unspecified wrist, hand and finger(s), initial encounter: Secondary | ICD-10-CM

## 2014-05-01 ENCOUNTER — Other Ambulatory Visit (HOSPITAL_COMMUNITY)
Admission: RE | Admit: 2014-05-01 | Discharge: 2014-05-01 | Disposition: A | Discharge status: Home / Self Care | Payer: 59 | Source: Ambulatory Visit | Attending: Obstetrics and Gynecology | Admitting: Obstetrics and Gynecology

## 2014-05-01 ENCOUNTER — Other Ambulatory Visit: Payer: Self-pay | Admitting: Obstetrics and Gynecology

## 2014-05-01 DIAGNOSIS — Z1151 Encounter for screening for human papillomavirus (HPV): Secondary | ICD-10-CM | POA: Insufficient documentation

## 2014-05-01 DIAGNOSIS — Z01419 Encounter for gynecological examination (general) (routine) without abnormal findings: Secondary | ICD-10-CM | POA: Diagnosis present

## 2014-05-05 LAB — CYTOLOGY - PAP

## 2015-12-09 ENCOUNTER — Emergency Department (HOSPITAL_COMMUNITY): Payer: Managed Care, Other (non HMO)

## 2015-12-09 ENCOUNTER — Encounter (HOSPITAL_COMMUNITY): Payer: Self-pay | Admitting: Vascular Surgery

## 2015-12-09 ENCOUNTER — Emergency Department (HOSPITAL_COMMUNITY)
Admission: EM | Admit: 2015-12-09 | Discharge: 2015-12-09 | Disposition: A | Discharge status: Home / Self Care | Payer: Managed Care, Other (non HMO) | Attending: Emergency Medicine | Admitting: Emergency Medicine

## 2015-12-09 DIAGNOSIS — R079 Chest pain, unspecified: Secondary | ICD-10-CM | POA: Insufficient documentation

## 2015-12-09 DIAGNOSIS — R42 Dizziness and giddiness: Secondary | ICD-10-CM | POA: Insufficient documentation

## 2015-12-09 HISTORY — DX: Insomnia, unspecified: G47.00

## 2015-12-09 HISTORY — DX: Gastro-esophageal reflux disease without esophagitis: K21.9

## 2015-12-09 HISTORY — DX: Anxiety disorder, unspecified: F41.9

## 2015-12-09 LAB — BASIC METABOLIC PANEL
ANION GAP: 11 (ref 5–15)
BUN: 11 mg/dL (ref 6–20)
CHLORIDE: 106 mmol/L (ref 101–111)
CO2: 24 mmol/L (ref 22–32)
Calcium: 9.5 mg/dL (ref 8.9–10.3)
Creatinine, Ser: 0.7 mg/dL (ref 0.44–1.00)
GFR calc Af Amer: >60 mL/min (ref >60)
GLUCOSE: 138 mg/dL — AB (ref 65–99)
POTASSIUM: 4.1 mmol/L (ref 3.5–5.1)
Sodium: 141 mmol/L (ref 135–145)

## 2015-12-09 LAB — CBC
HCT: 40.5 % (ref 36.0–46.0)
HEMOGLOBIN: 13.6 g/dL (ref 12.0–15.0)
MCH: 31.1 pg (ref 26.0–34.0)
MCHC: 33.6 g/dL (ref 30.0–36.0)
MCV: 92.5 fL (ref 78.0–100.0)
PLATELETS: 235 10*3/uL (ref 150–400)
RBC: 4.38 MIL/uL (ref 3.87–5.11)
RDW: 12.4 % (ref 11.5–15.5)
WBC: 6.1 10*3/uL (ref 4.0–10.5)

## 2015-12-09 LAB — I-STAT TROPONIN, ED: Troponin i, poc: 0 ng/mL (ref 0.00–0.08)

## 2015-12-09 NOTE — ED Notes (Signed)
Pt reports to the ED for eval of CP x 1 week. She reports she called and tried to get an earlier appt at this cardiologist but they couldn't get her in until march 17th. Reports associated symptoms of lightheadedness. Pt denies any aggravating or relieving factors. Describes it as a constant dull ache with intermittent sharp pains. Also reports intermittent pain radiating down her left arm. Dx with mitral valve prolapse in 2009. Pt A&Ox4, resp e/u, and skin warm and dry.

## 2015-12-18 ENCOUNTER — Encounter: Payer: Self-pay | Admitting: Cardiology

## 2015-12-18 ENCOUNTER — Ambulatory Visit (INDEPENDENT_AMBULATORY_CARE_PROVIDER_SITE_OTHER): Payer: Managed Care, Other (non HMO) | Admitting: Cardiology

## 2015-12-18 VITALS — BP 128/78 | HR 68 | Wt 203.1 lb

## 2015-12-18 DIAGNOSIS — I341 Nonrheumatic mitral (valve) prolapse: Secondary | ICD-10-CM | POA: Diagnosis not present

## 2015-12-18 DIAGNOSIS — R002 Palpitations: Secondary | ICD-10-CM | POA: Diagnosis not present

## 2015-12-18 DIAGNOSIS — Z8249 Family history of ischemic heart disease and other diseases of the circulatory system: Secondary | ICD-10-CM | POA: Diagnosis not present

## 2015-12-18 DIAGNOSIS — R079 Chest pain, unspecified: Secondary | ICD-10-CM

## 2015-12-18 HISTORY — DX: Family history of ischemic heart disease and other diseases of the circulatory system: Z82.49

## 2015-12-18 NOTE — Progress Notes (Signed)
Cardiology Office Note   Date:  12/18/2015   ID:  Allison Yang, DOB 19-Apr-1961, MRN [id number]  PCP:  No primary care provider on file.    Chief Complaint  Patient presents with  . Palpitations  . Chest Pain    some chest pain      History of Present Illness: Allison Yang is a 55 y.o. female who presents for evaluation of chest pain.  She has a history of MVP noted during workup of CP in 2009.  She is now having recurrent CP with palpitations.  She has been having a lot of stress in her life recently.  She went to the ER last Wednesday with an episode of CP and EKG and CXRAY were normal but she left AMA because the wait was too long.  She describes her CP as sharp and midsternal in location and spreads into her left arm. She also has a dull ache that is there all the time.  It is very similar to what she had in 2009.  The dull ache is constant and never goes away for the past 3 weeks. Nothing makes it worse or better.  She has tried Scientist, research (medical)TUMS without relief.  She also has had some palpitations that are sporadic and occur 5-6 times daily.  SHe does not have them at night like she did in 2009.  She gets lightheaded with the palpitations but no Syncope.  She denies any LE edema.     Past Medical History  Diagnosis Date  . GERD (gastroesophageal reflux disease)   . Anxiety   . Insomnia   . HSV-2 infection   . Hypothyroidism   . Glaucoma   . MVP (mitral valve prolapse)   . Fatty liver   . Fibromyalgia   . Allergic rhinitis   . Family history of premature coronary artery disease 12/18/2015  . Chest pain     noncardiac with normal cath in 2009    Past Surgical History  Procedure Laterality Date  . Cardiac catheterization    . Intraocular lens implant, secondary    . Replacement total knee Bilateral   . Uterine fibroid surgery    . Back surgery    . Tonsillectomy    . Wisdom tooth extraction       Current Outpatient Prescriptions  Medication Sig Dispense  Refill  . ALPRAZolam (XANAX) 0.5 MG tablet Take 0.25 mg by mouth at bedtime.    . busPIRone (BUSPAR) 30 MG tablet Take 30 mg by mouth 2 (two) times daily.    Marland Kitchen. estazolam (PROSOM) 1 MG tablet Take 1 mg by mouth as directed.    Marland Kitchen. omeprazole (PRILOSEC) 40 MG capsule Take 40 mg by mouth daily.    . timolol (BETIMOL) 0.5 % ophthalmic solution Place 1 drop into both eyes daily.     No current facility-administered medications for this visit.    Allergies:   Review of patient's allergies indicates not on file.    Social History:  The patient  reports that she has never smoked. She has never used smokeless tobacco. She reports that she drinks alcohol. She reports that she does not use illicit drugs.   Family History:  The patient's family history includes Breast cancer in her mother; CAD in her mother; Heart disease (age of onset: 1267) in her mother; Heart failure in her mother.    ROS:  Please see  the history of present illness.   Otherwise, review of systems are positive for none.   All other systems are reviewed and negative.    PHYSICAL EXAM: VS:  BP 128/78 mmHg  Pulse 68 , BMI There is no height or weight on file to calculate BMI. GEN: Well nourished, well developed, in no acute distress HEENT: normal Neck: no JVD, carotid bruits, or masses Cardiac: RRR; no murmurs, rubs, or gallops,no edema  Respiratory:  clear to auscultation bilaterally, normal work of breathing GI: soft, nontender, nondistended, + BS MS: no deformity or atrophy Skin: warm and dry, no rash Neuro:  Strength and sensation are intact Psych: euthymic mood, full affect   EKG:  EKG is ordered today. The ekg ordered today demonstrates    Recent Labs: 12/09/2015: BUN 11; Creatinine, Ser 0.70; Hemoglobin 13.6; Platelets 235; Potassium 4.1; Sodium 141    Lipid Panel No results found for: CHOL, TRIG, HDL, CHOLHDL, VLDL, LDLCALC, LDLDIRECT    Wt Readings from Last 3 Encounters:  No data found for Wt         ASSESSMENT AND PLAN:  1.  Chest pain with typical and atypical components.  She had a negative workup for this in 2009 with normal cardiac cath.  Cardiac risk factors include post menopausal state, family history of premature CAD and obesity. She has never smoked.  EKG is nonischemic.  I will get a stress myoview to rule out ischemia.  2.  MVP by echo in 2009 - will repeat echo to assure stability 3.  Obesity with history of fatty liver - encouraged her to get into a routine exercise and diet program.   4.  Family history of premature CAD 5.  Palpitations - I will get a 30 day heart monitor to rule out PAF.    Current medicines are reviewed at length with the patient today.  The patient does not have concerns regarding medicines.  The following changes have been made:  no change  Labs/ tests ordered today: See above Assessment and Plan No orders of the defined types were placed in this encounter.     Disposition:   FU with me in 1 year  Signed, Quintella Reichert, MD  12/18/2015 12:03 PM    [facility] [address] [place], [place], [place]  [address] Phone: [phone]; Fax: [phone]

## 2015-12-18 NOTE — Patient Instructions (Signed)
Medication Instructions:  Your physician recommends that you continue on your current medications as directed. Please refer to the Current Medication list given to you today.   Labwork: None  Testing/Procedures: Your physician has requested that you have an echocardiogram. Echocardiography is a painless test that uses sound waves to create images of your heart. It provides your doctor with information about the size and shape of your heart and how well your heart's chambers and valves are working. This procedure takes approximately one hour. There are no restrictions for this procedure.   Dr. Mayford Knifeurner recommends you have a NUCLEAR STRESS TEST.  Your physician has recommended that you wear an event monitor. Event monitors are medical devices that record the heart's electrical activity. Doctors most often us these monitors to diagnose arrhythmias. Arrhythmias are problems with the speed or rhythm of the heartbeat. The monitor is a small, portable device. You can wear one while you do your normal daily activities. This is usually used to diagnose what is causing palpitations/syncope (passing out).  Follow-Up: Your physician wants you to follow-up in: 1 year with Dr. Mayford Knifeurner. You will receive a reminder letter in the mail two months in advance. If you don't receive a letter, please call our office to schedule the follow-up appointment.   Any Other Special Instructions Will Be Listed Below (If Applicable).     If you need a refill on your cardiac medications before your next appointment, please call your pharmacy.

## 2016-01-22 ENCOUNTER — Other Ambulatory Visit (HOSPITAL_COMMUNITY): Payer: Managed Care, Other (non HMO)

## 2016-01-22 ENCOUNTER — Encounter (HOSPITAL_COMMUNITY): Payer: Managed Care, Other (non HMO)

## 2016-02-17 ENCOUNTER — Telehealth: Payer: Self-pay | Admitting: Cardiology

## 2016-02-17 NOTE — Telephone Encounter (Signed)
Allison Yang was schedule for event monitor, echo and myoview on 02-19-16.  Patient called on 02-09-16 and cancel to to she was out of town on business/will call to reschedule.   Called patient to see if she was ready to schedule.   Patient stated that she no longer having a problem and [name].

## 2016-02-18 ENCOUNTER — Other Ambulatory Visit: Payer: Self-pay | Admitting: Family Medicine

## 2016-02-18 ENCOUNTER — Ambulatory Visit
Admission: RE | Admit: 2016-02-18 | Discharge: 2016-02-18 | Disposition: A | Discharge status: Home / Self Care | Payer: Managed Care, Other (non HMO) | Source: Ambulatory Visit | Attending: Family Medicine | Admitting: Family Medicine

## 2016-02-18 DIAGNOSIS — R059 Cough, unspecified: Secondary | ICD-10-CM

## 2016-02-18 DIAGNOSIS — R05 Cough: Secondary | ICD-10-CM

## 2016-02-19 ENCOUNTER — Encounter (HOSPITAL_COMMUNITY): Payer: Managed Care, Other (non HMO)

## 2016-02-19 ENCOUNTER — Other Ambulatory Visit (HOSPITAL_COMMUNITY): Payer: Managed Care, Other (non HMO)

## 2016-04-20 ENCOUNTER — Ambulatory Visit
Admission: RE | Admit: 2016-04-20 | Discharge: 2016-04-20 | Disposition: A | Discharge status: Home / Self Care | Payer: Managed Care, Other (non HMO) | Source: Ambulatory Visit | Attending: Family Medicine | Admitting: Family Medicine

## 2016-04-20 ENCOUNTER — Other Ambulatory Visit: Payer: Self-pay | Admitting: Family Medicine

## 2016-04-20 DIAGNOSIS — J189 Pneumonia, unspecified organism: Secondary | ICD-10-CM

## 2016-04-21 ENCOUNTER — Other Ambulatory Visit: Payer: Self-pay | Admitting: Obstetrics and Gynecology

## 2016-04-21 ENCOUNTER — Other Ambulatory Visit (HOSPITAL_COMMUNITY)
Admission: RE | Admit: 2016-04-21 | Discharge: 2016-04-21 | Disposition: A | Discharge status: Home / Self Care | Payer: Managed Care, Other (non HMO) | Source: Ambulatory Visit | Attending: Obstetrics and Gynecology | Admitting: Obstetrics and Gynecology

## 2016-04-21 DIAGNOSIS — Z1151 Encounter for screening for human papillomavirus (HPV): Secondary | ICD-10-CM | POA: Diagnosis present

## 2016-04-21 DIAGNOSIS — Z01419 Encounter for gynecological examination (general) (routine) without abnormal findings: Secondary | ICD-10-CM | POA: Insufficient documentation

## 2016-04-22 LAB — CYTOLOGY - PAP

## 2017-05-15 IMAGING — CR DG CHEST 2V
2 series · 2 of 2 positions shown · non-contrast
Comparison: 10/14/2010

CLINICAL DATA: Chest pain for 1 week, left  arm pain

EXAM:
CHEST  2 VIEW

[chest pa]
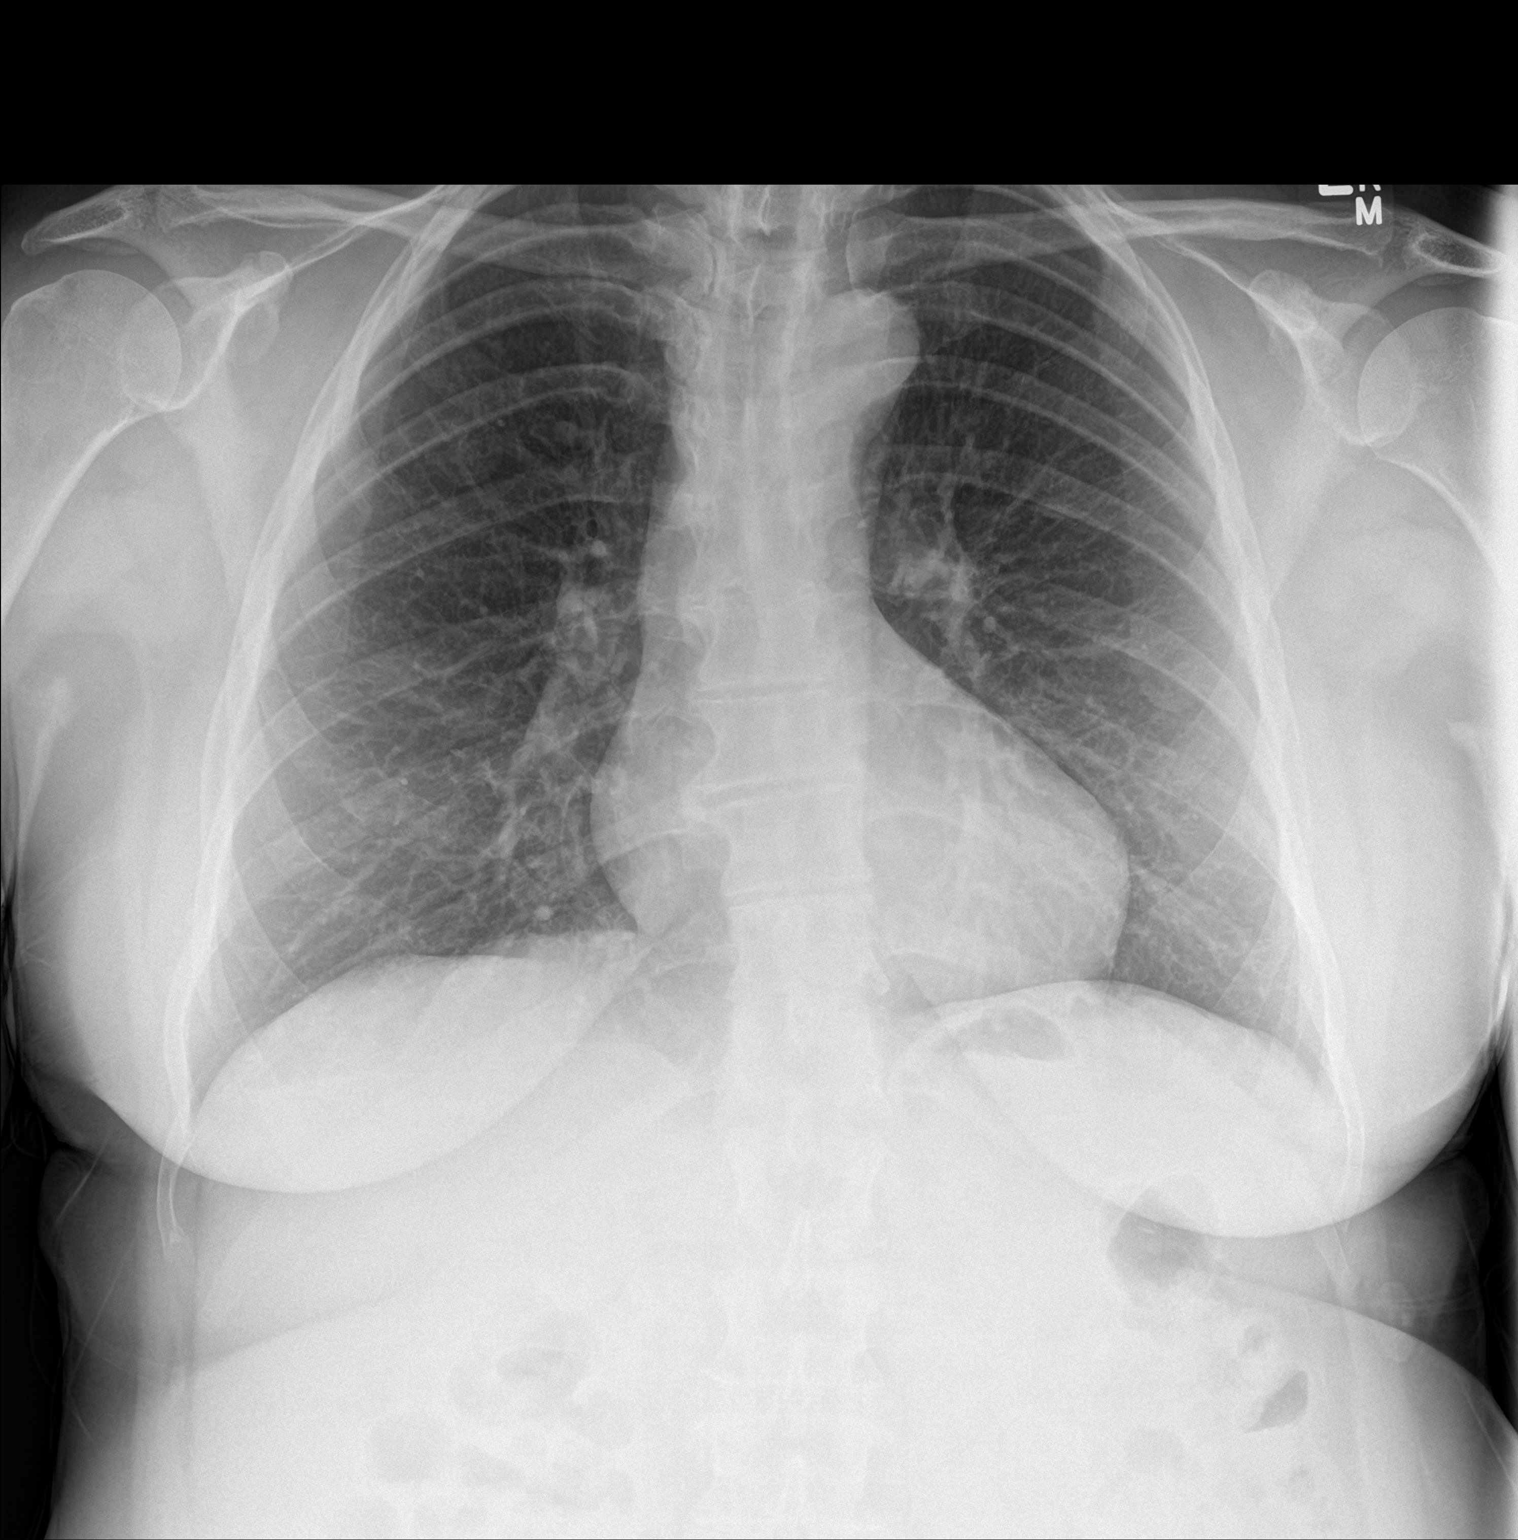

[chest lat]
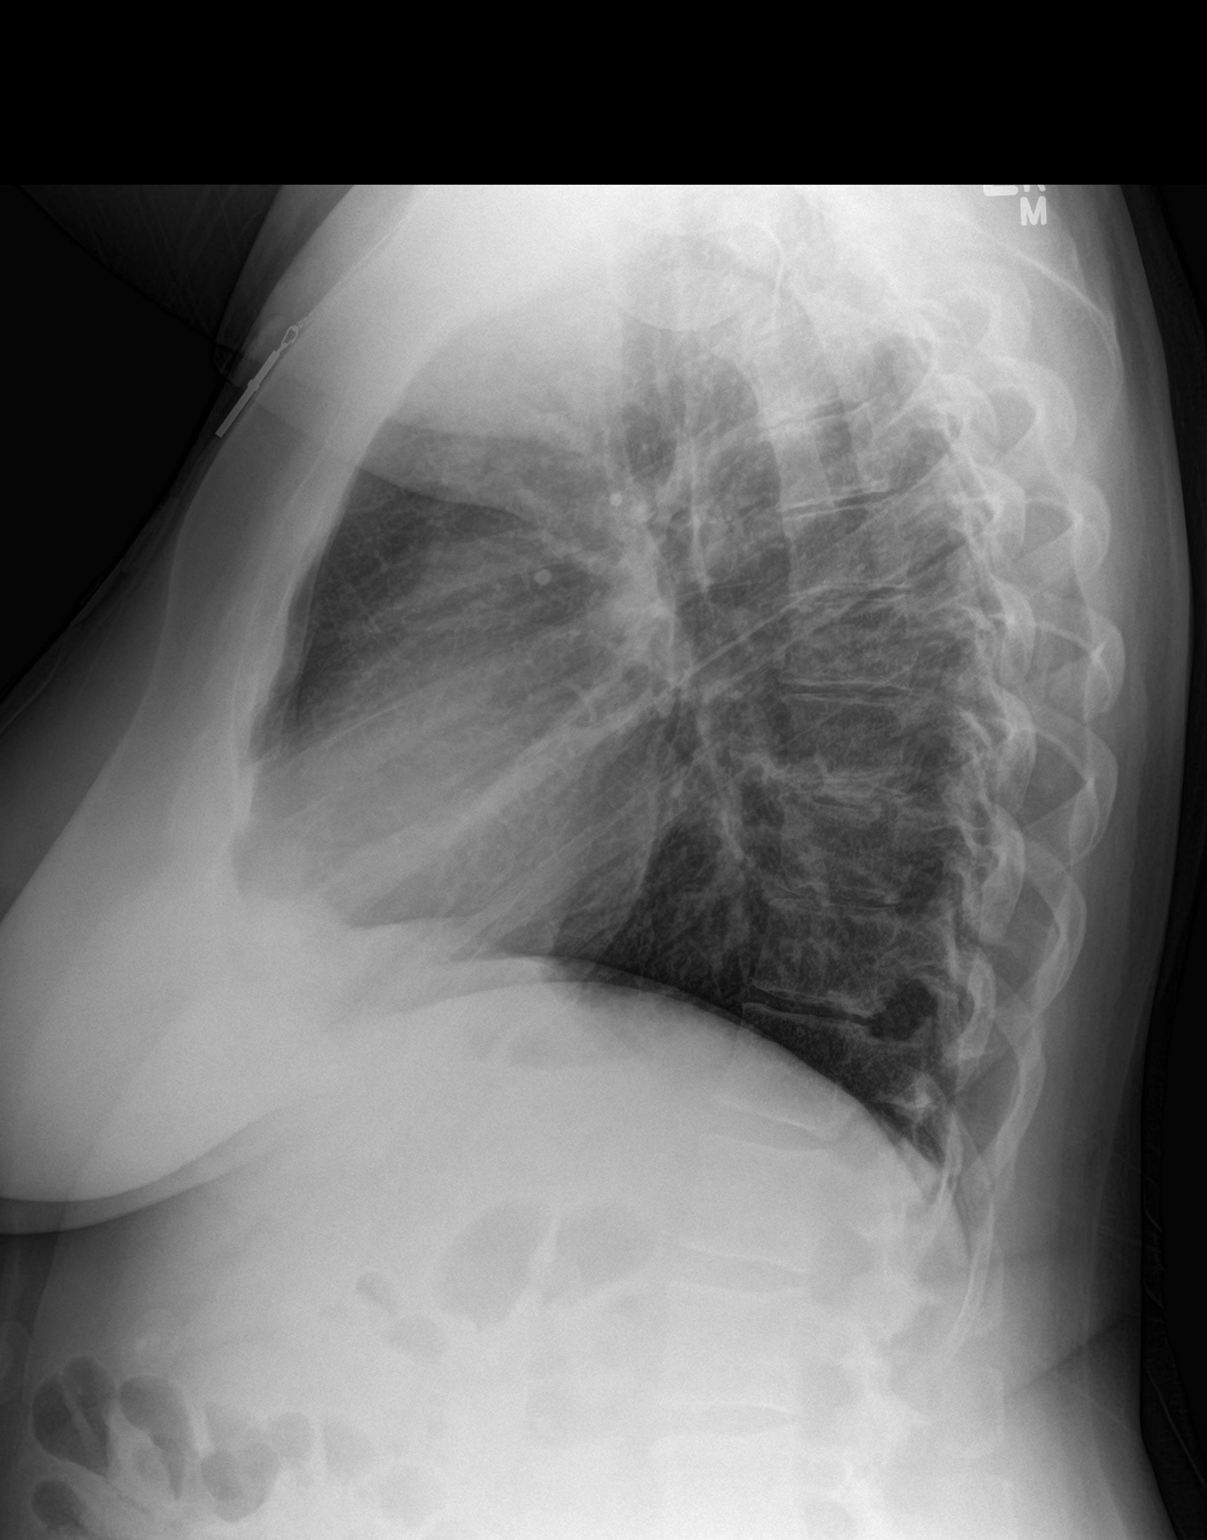

[2 of 2 positions shown; findings below may reference images not displayed]

FINDINGS: Cardiomediastinal silhouette is stable. No acute infiltrate or
pleural effusion. No pulmonary edema. Bony thorax is unremarkable.
IMPRESSION: No active cardiopulmonary disease.
